# Patient Record
Sex: Male | Born: 1970 | Race: White | Hispanic: No | Marital: Married | Smoking: Never smoker
Health system: Southern US, Community
[De-identification: ages and names within clinical notes are randomized; demographics above are authoritative.]

## PROBLEM LIST (undated history)

## (undated) DIAGNOSIS — I10 Essential (primary) hypertension: Secondary | ICD-10-CM

## (undated) HISTORY — PX: EYE SURGERY: SHX253

## (undated) HISTORY — DX: Essential (primary) hypertension: I10

---

## 2003-06-17 ENCOUNTER — Emergency Department (HOSPITAL_COMMUNITY): Admission: EM | Admit: 2003-06-17 | Discharge: 2003-06-17 | Payer: Self-pay | Admitting: Emergency Medicine

## 2003-06-24 ENCOUNTER — Emergency Department (HOSPITAL_COMMUNITY): Admission: EM | Admit: 2003-06-24 | Discharge: 2003-06-24 | Payer: Self-pay | Admitting: Emergency Medicine

## 2003-07-04 ENCOUNTER — Encounter (HOSPITAL_COMMUNITY): Admission: RE | Admit: 2003-07-04 | Discharge: 2003-07-12 | Payer: Self-pay | Admitting: Preventative Medicine

## 2003-07-16 ENCOUNTER — Encounter (HOSPITAL_COMMUNITY): Admission: RE | Admit: 2003-07-16 | Discharge: 2003-08-15 | Payer: Self-pay | Admitting: Preventative Medicine

## 2003-09-11 ENCOUNTER — Encounter (HOSPITAL_COMMUNITY): Admission: RE | Admit: 2003-09-11 | Discharge: 2003-10-11 | Payer: Self-pay | Admitting: Orthopedic Surgery

## 2005-10-19 ENCOUNTER — Ambulatory Visit (HOSPITAL_COMMUNITY): Admission: RE | Admit: 2005-10-19 | Discharge: 2005-10-19 | Payer: Self-pay | Admitting: Internal Medicine

## 2006-07-28 IMAGING — CR DG LUMBAR SPINE COMPLETE 4+V
5 series · 5 of 5 positions shown · non-contrast
Comparison: none

CLINICAL DATA: Low back pain

Lumbar spine five-view:
No previous for comparison. Bilateral pelvic phleboliths.  There is no evidence
of lumbar spine fracture.  Alignment is normal.  Intervertebral disc spaces are
maintained, and no other significant bone abnormalities are identified.

[view not recorded (1 of 5)]
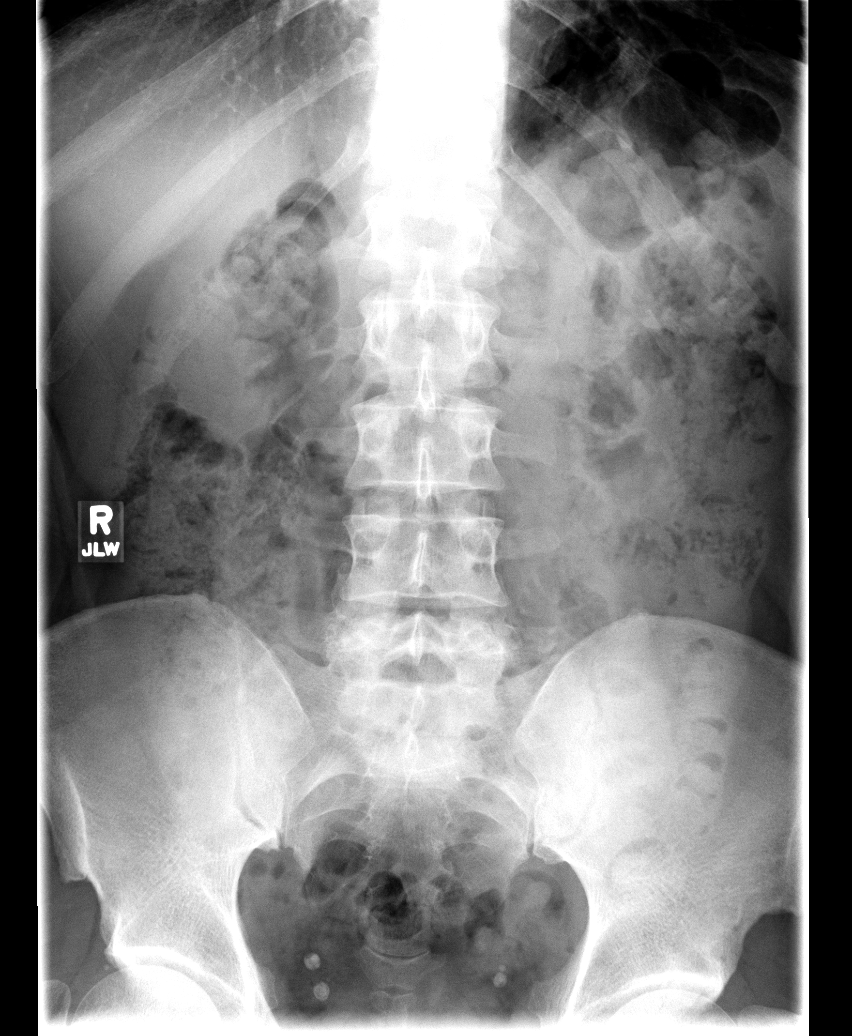

[view not recorded (2 of 5)]
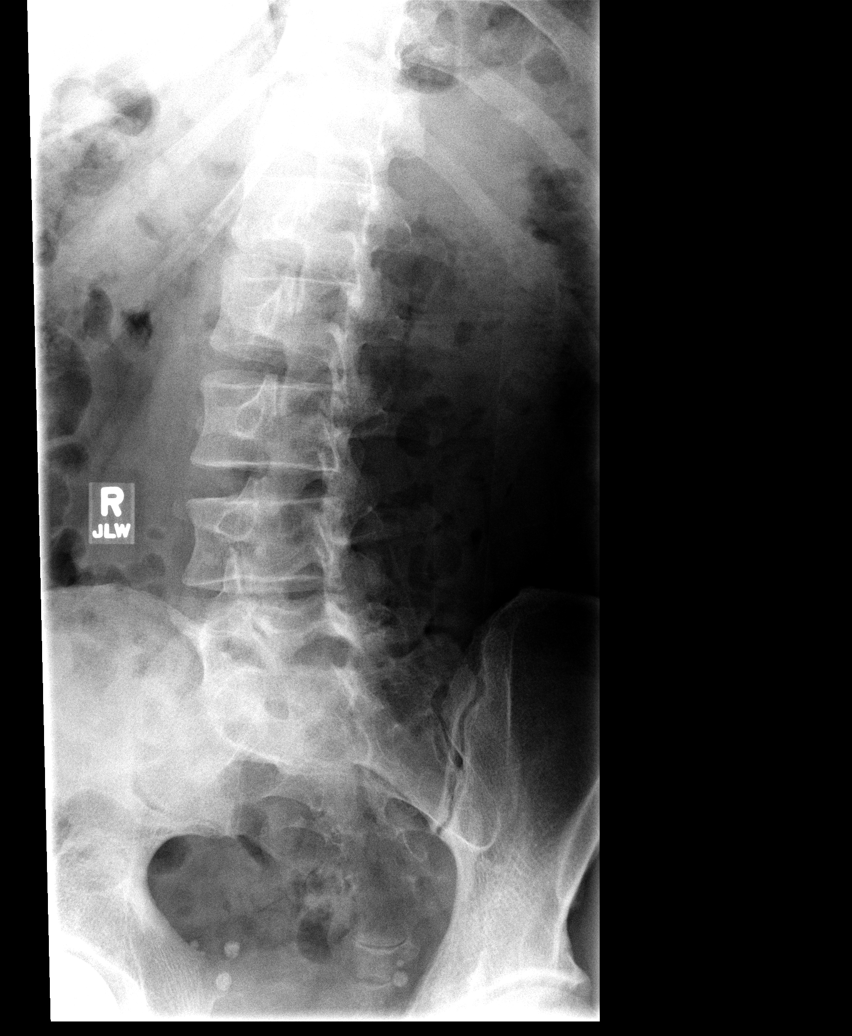

[view not recorded (3 of 5)]
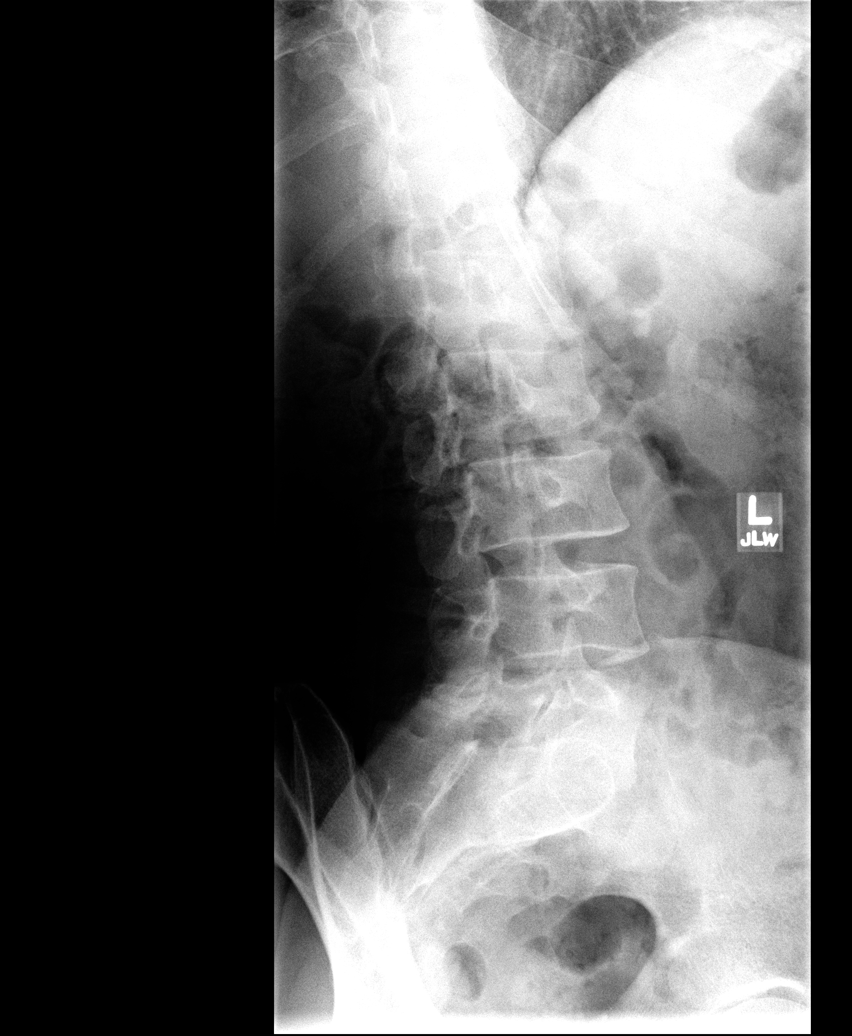

[view not recorded (4 of 5)]
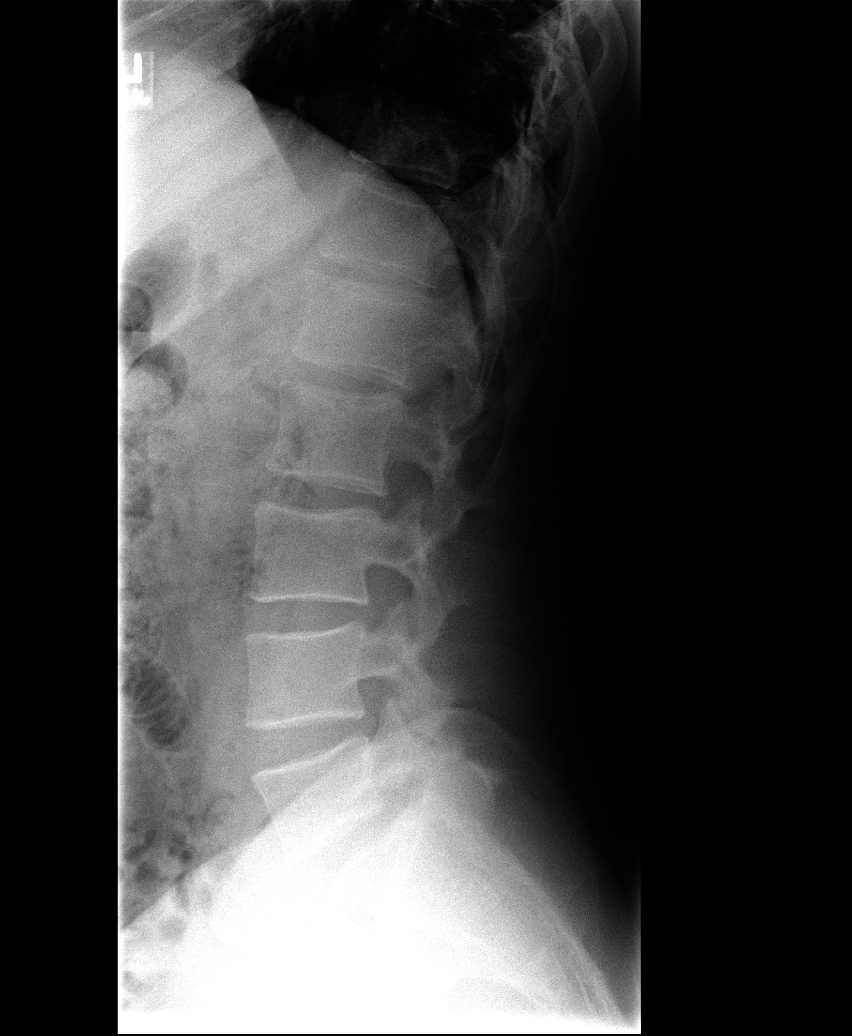

[view not recorded (5 of 5)]
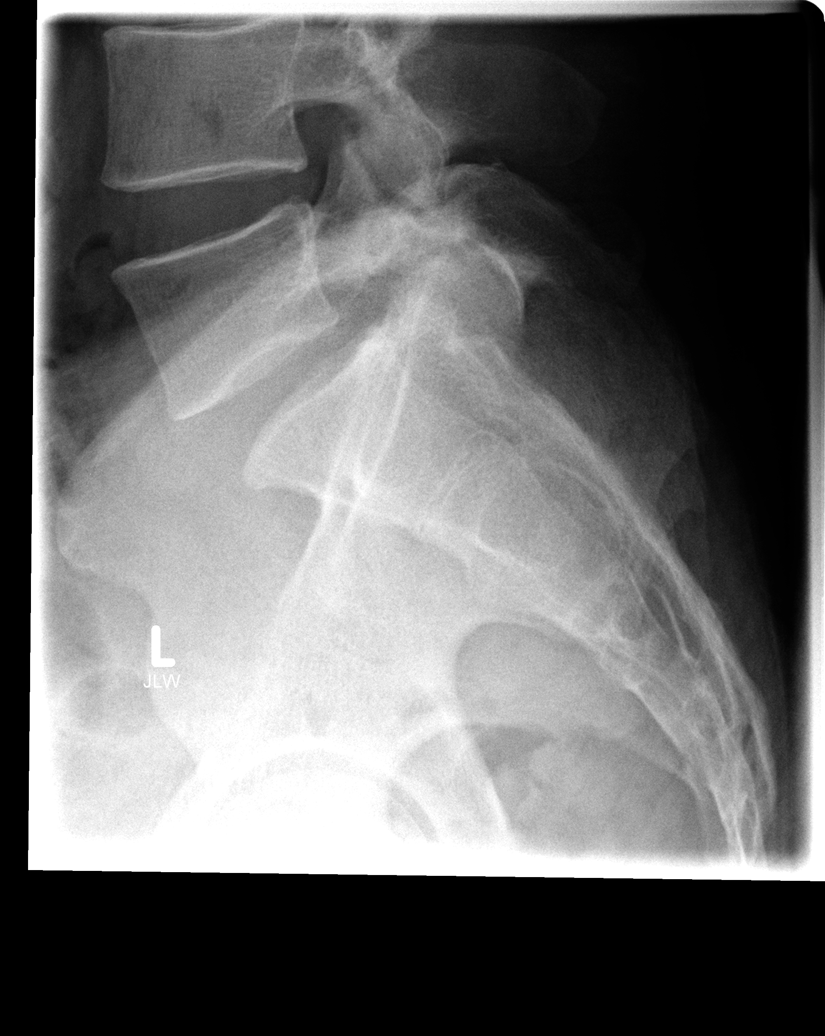

[5 of 5 positions shown; findings below may reference images not displayed]

IMPRESSION: Negative lumbar spine radiographs.

Thoracic spine 2 view:

12 rib-bearing thoracic segments. No previous for comparison.   There is no
evidence of thoracic spine fracture.  Alignment is normal.  No other significant
bone abnormalities are identified.
IMPRESSION: Negative thoracic spine radiographs.

## 2006-07-28 IMAGING — CR DG THORACIC SPINE 2V
3 series · 3 of 3 positions shown · non-contrast
Comparison: none

CLINICAL DATA: Low back pain

Lumbar spine five-view:
No previous for comparison. Bilateral pelvic phleboliths.  There is no evidence
of lumbar spine fracture.  Alignment is normal.  Intervertebral disc spaces are
maintained, and no other significant bone abnormalities are identified.

[view not recorded (1 of 3)]
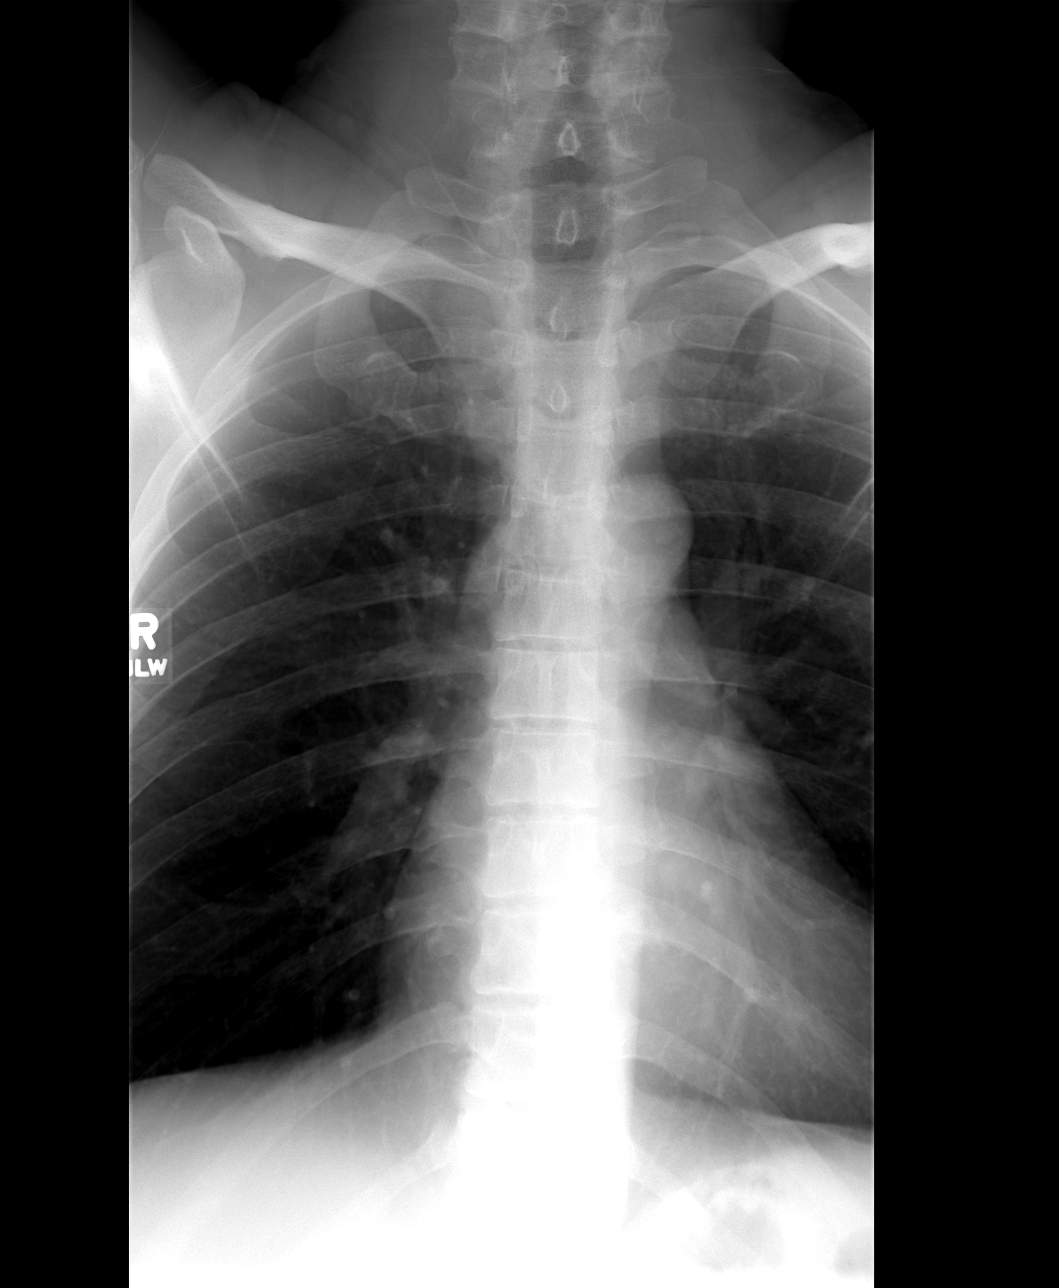

[view not recorded (2 of 3)]
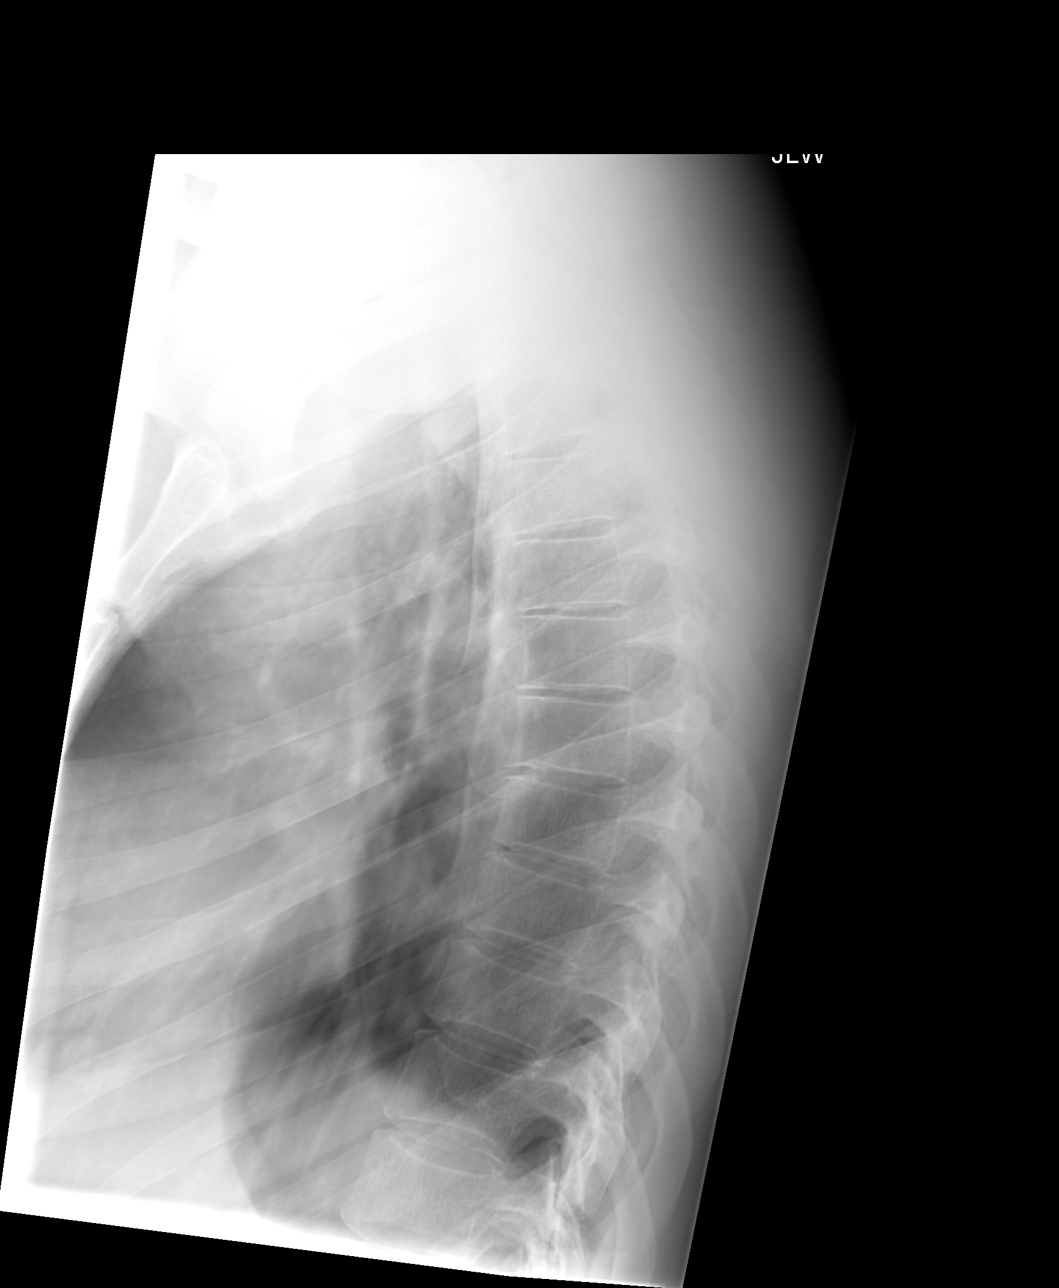

[view not recorded (3 of 3)]
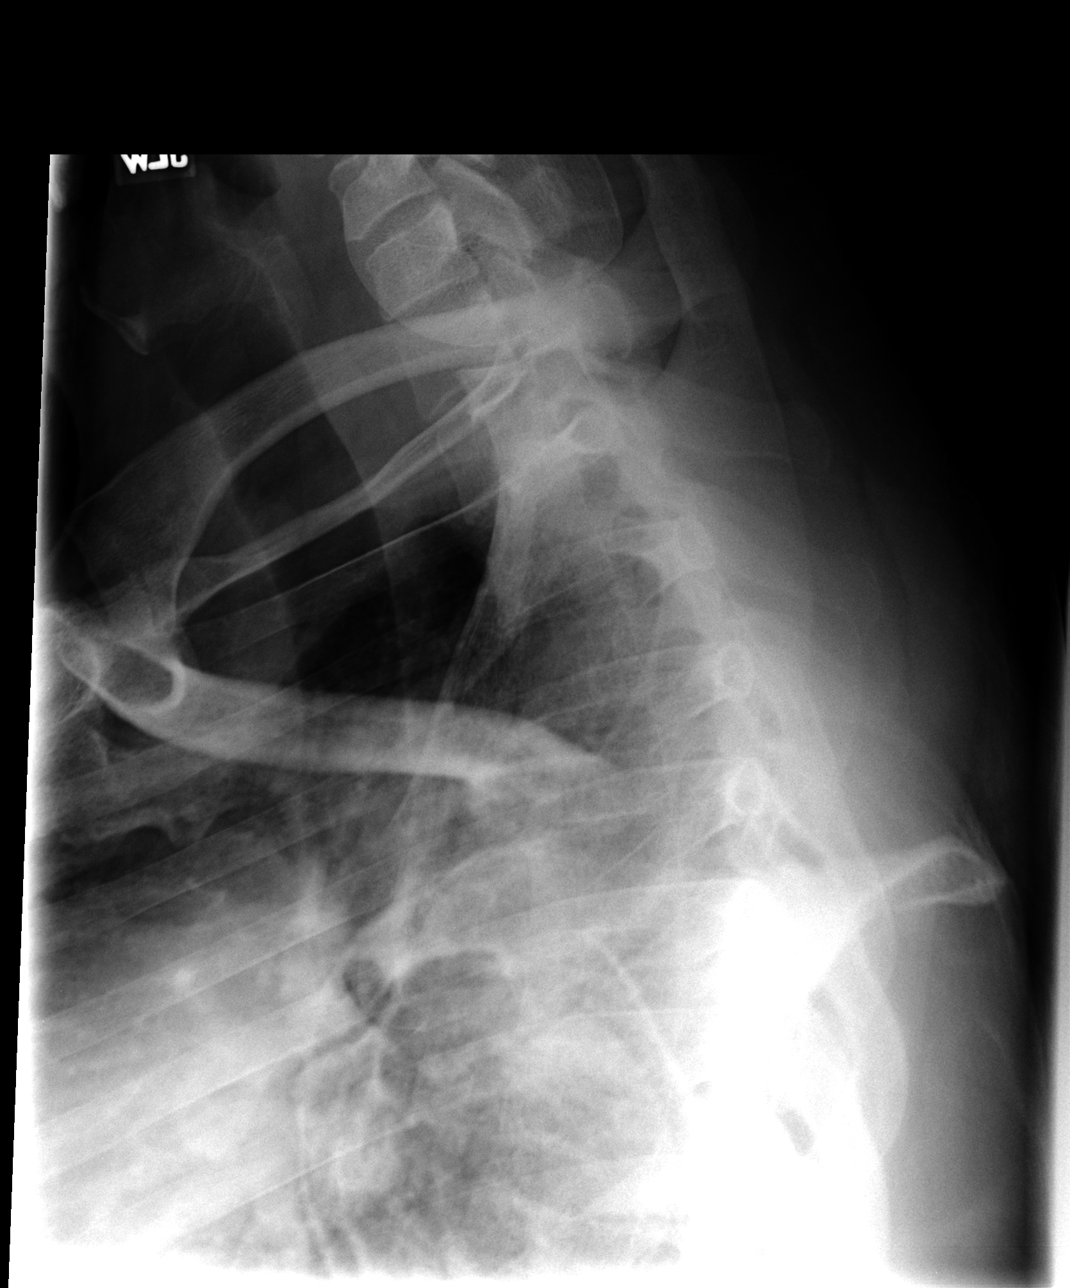

[3 of 3 positions shown; findings below may reference images not displayed]

IMPRESSION: Negative lumbar spine radiographs.

Thoracic spine 2 view:

12 rib-bearing thoracic segments. No previous for comparison.   There is no
evidence of thoracic spine fracture.  Alignment is normal.  No other significant
bone abnormalities are identified.
IMPRESSION: Negative thoracic spine radiographs.

## 2015-07-23 ENCOUNTER — Encounter (INDEPENDENT_AMBULATORY_CARE_PROVIDER_SITE_OTHER): Payer: Self-pay | Admitting: *Deleted

## 2015-08-05 ENCOUNTER — Ambulatory Visit (INDEPENDENT_AMBULATORY_CARE_PROVIDER_SITE_OTHER): Payer: BLUE CROSS/BLUE SHIELD | Admitting: Internal Medicine

## 2015-08-05 ENCOUNTER — Encounter (INDEPENDENT_AMBULATORY_CARE_PROVIDER_SITE_OTHER): Payer: Self-pay | Admitting: Internal Medicine

## 2015-08-05 ENCOUNTER — Other Ambulatory Visit (INDEPENDENT_AMBULATORY_CARE_PROVIDER_SITE_OTHER): Payer: Self-pay | Admitting: Internal Medicine

## 2015-08-05 ENCOUNTER — Telehealth (INDEPENDENT_AMBULATORY_CARE_PROVIDER_SITE_OTHER): Payer: Self-pay | Admitting: *Deleted

## 2015-08-05 VITALS — BP 108/80 | HR 64 | Temp 97.5°F | Ht 71.0 in | Wt 206.8 lb

## 2015-08-05 DIAGNOSIS — K625 Hemorrhage of anus and rectum: Secondary | ICD-10-CM | POA: Diagnosis not present

## 2015-08-05 DIAGNOSIS — I10 Essential (primary) hypertension: Secondary | ICD-10-CM | POA: Insufficient documentation

## 2015-08-05 DIAGNOSIS — Z1211 Encounter for screening for malignant neoplasm of colon: Secondary | ICD-10-CM

## 2015-08-05 NOTE — Progress Notes (Signed)
   Subjective:    Patient ID: Richard Nicholson, male    DOB: Feb 19, 1971, 45 y.o.   MRN: [id number]  HPI Referred to our office by Dr. Willey Blade for rectal bleeding x 1 year. States he will have rectal bleeding in his stool. He describes as bright red in the commode. Symptoms off and on for a year or better. He says he can goes for months without bleeding. He saw Dr. Willey Blade and ordered CBC. Stool was negative in his office. Did not feel any rectal masses. Appetite is good. No weight loss.  BMs are normal.  No recent melena or BRRB. No abdominal.  No family hx of colon cancer as far as he knows.  No NSAIDs.   07/16/2015 H andH 14.7 and 42.9, MCV 91, Platelet ct 199  Review of Systems Past Medical History  Diagnosis Date  . Hypertension     Past Surgical History  Procedure Laterality Date  . Eye surgery      lazy eyed    No Known Allergies  No current outpatient prescriptions on file prior to visit.   No current facility-administered medications on file prior to visit.   Current Outpatient Prescriptions  Medication Sig Dispense Refill  . lisinopril (PRINIVIL,ZESTRIL) 20 MG tablet Take 40 mg by mouth daily.      No current facility-administered medications for this visit.        Objective:   Physical Exam Blood pressure 108/80, pulse 64, temperature 97.5 F (36.4 C), height 5\' 11"  (1.803 m), weight 206 lb 12.8 oz (93.804 kg).  Alert and oriented. Skin warm and dry. Oral mucosa is moist.   . Sclera anicteric, conjunctivae is pink. Thyroid not enlarged. No cervical lymphadenopathy. Lungs clear. Heart regular rate and rhythm.  Abdomen is soft. Bowel sounds are positive. No hepatomegaly. No abdominal masses felt. No tenderness.  No edema to lower extremities.  Rectal deferred.       Assessment & Plan:  Rectal bleeding. ? Etiology.  Colonic neoplasm needs to be ruled. AVM, polyp, ulcer, hemorroid and diverticular bleed in the differential.  The risks and benefits such as perforation,  bleeding, and infection were reviewed with the patient and is agreeable.

## 2015-08-05 NOTE — Patient Instructions (Signed)
Colonoscopy.  The risks and benefits such as perforation, bleeding, and infection were reviewed with the patient and is agreeable. 

## 2015-08-05 NOTE — Telephone Encounter (Signed)
Patient needs trilyte 

## 2015-08-11 MED ORDER — PEG 3350-KCL-NA BICARB-NACL 420 G PO SOLR: 4000.0000 mL | Freq: Once | ORAL | Status: DC

## 2015-09-03 ENCOUNTER — Encounter (HOSPITAL_COMMUNITY): Payer: Self-pay

## 2015-09-03 ENCOUNTER — Encounter (HOSPITAL_COMMUNITY)
Admission: RE | Disposition: A | Discharge status: Home / Self Care | Payer: Self-pay | Source: Ambulatory Visit | Attending: Internal Medicine

## 2015-09-03 ENCOUNTER — Ambulatory Visit (HOSPITAL_COMMUNITY)
Admission: RE | Admit: 2015-09-03 | Discharge: 2015-09-03 | Disposition: A | Discharge status: Home / Self Care | Payer: BLUE CROSS/BLUE SHIELD | Source: Ambulatory Visit | Attending: Internal Medicine | Admitting: Internal Medicine

## 2015-09-03 DIAGNOSIS — K625 Hemorrhage of anus and rectum: Secondary | ICD-10-CM

## 2015-09-03 DIAGNOSIS — K921 Melena: Secondary | ICD-10-CM | POA: Diagnosis not present

## 2015-09-03 DIAGNOSIS — K648 Other hemorrhoids: Secondary | ICD-10-CM | POA: Insufficient documentation

## 2015-09-03 DIAGNOSIS — D123 Benign neoplasm of transverse colon: Secondary | ICD-10-CM | POA: Diagnosis not present

## 2015-09-03 DIAGNOSIS — Z79899 Other long term (current) drug therapy: Secondary | ICD-10-CM | POA: Insufficient documentation

## 2015-09-03 DIAGNOSIS — I10 Essential (primary) hypertension: Secondary | ICD-10-CM | POA: Insufficient documentation

## 2015-09-03 HISTORY — PX: COLONOSCOPY: SHX5424

## 2015-09-03 SURGERY — COLONOSCOPY
Anesthesia: Moderate Sedation

## 2015-09-03 MED ORDER — MEPERIDINE HCL 50 MG/ML IJ SOLN
INTRAMUSCULAR | Status: AC
  Filled 2015-09-03: qty 1

## 2015-09-03 MED ORDER — SODIUM CHLORIDE 0.9 % IV SOLN
INTRAVENOUS | Status: DC
  Administered 2015-09-03: 13:00:00 via INTRAVENOUS

## 2015-09-03 MED ORDER — MIDAZOLAM HCL 5 MG/5ML IJ SOLN
INTRAMUSCULAR | Status: AC
  Filled 2015-09-03: qty 5

## 2015-09-03 MED ORDER — MIDAZOLAM HCL 5 MG/5ML IJ SOLN
INTRAMUSCULAR | Status: DC | PRN
  Administered 2015-09-03 (×2): 2 mg via INTRAVENOUS
  Administered 2015-09-03 (×2): 3 mg via INTRAVENOUS

## 2015-09-03 MED ORDER — BENEFIBER DRINK MIX PO PACK: 4.0000 g | PACK | Freq: Every day | ORAL | Status: AC

## 2015-09-03 MED ORDER — STERILE WATER FOR IRRIGATION IR SOLN
Status: DC | PRN
  Administered 2015-09-03: 14:00:00

## 2015-09-03 MED ORDER — MEPERIDINE HCL 50 MG/ML IJ SOLN
INTRAMUSCULAR | Status: DC | PRN
  Administered 2015-09-03 (×4): 25 mg via INTRAVENOUS

## 2015-09-03 MED ORDER — MIDAZOLAM HCL 5 MG/5ML IJ SOLN
INTRAMUSCULAR | Status: AC
  Filled 2015-09-03: qty 10

## 2015-09-03 NOTE — H&P (Signed)
Richard Nicholson is an 45 y.o. male.   Chief Complaint: Patient is here for colonoscopy. HPI: Patient is 45 year old Caucasian male who presents with over a year history of intermittent rectal bleeding. Bleeding always occurs his bowels. However. One episode where he passed flatus and large amount of bright red blood. He denies diarrhea or constipation. He generally has 4-6 stools per day. This has been his pattern for years. He does not have abdominal pain or urgency. Almost all of his stools are formed. He does not have nocturnal bowel movements. Family history is negative for CRC.  Past Medical History  Diagnosis Date  . Hypertension     Past Surgical History  Procedure Laterality Date  . Eye surgery      lazy eyed    History reviewed. No pertinent family history. Social History:  reports that he has never smoked. He does not have any smokeless tobacco history on file. He reports that he drinks alcohol. He reports that he does not use illicit drugs.  Allergies: No Known Allergies  Medications Prior to Admission  Medication Sig Dispense Refill  . lisinopril (PRINIVIL,ZESTRIL) 40 MG tablet Take 1 tablet by mouth daily.    . polyethylene glycol-electrolytes (NULYTELY/GOLYTELY) 420 g solution Take 4,000 mLs by mouth once. 4000 mL 0    No results found for this or any previous visit (from the past 48 hour(s)). No results found.  ROS  Blood pressure 151/99, pulse 87, temperature 98.4 F (36.9 C), temperature source Oral, resp. rate 22, height 5\' 11"  (1.803 m), weight 209 lb (94.802 kg), SpO2 100 %. Physical Exam  Constitutional: He appears well-developed and well-nourished.  HENT:  Mouth/Throat: Oropharynx is clear and moist.  Prominent veins involving left half of palate  Eyes: Conjunctivae are normal. No scleral icterus.  Neck: No thyromegaly present.  Cardiovascular: Normal rate, regular rhythm and normal heart sounds.   No murmur heard. Respiratory: Effort normal and breath  sounds normal.  GI: Soft. He exhibits no distension and no mass. There is no tenderness.  Musculoskeletal: He exhibits no edema.  Lymphadenopathy:    He has no cervical adenopathy.  Neurological: He is alert.  Skin: Skin is warm and dry.     Assessment/Plan  Rectal bleeding.  Diagnostic colonoscopy.  Rogene Houston, MD 09/03/2015, 1:29 PM

## 2015-09-03 NOTE — Discharge Instructions (Signed)
Resume usual medications and diet. Benefiber 4 g by mouth daily at bedtime. No driving for 24 hours. Physician will call with biopsy results.    Colonoscopy, Care After These instructions give you information on caring for yourself after your procedure. Your doctor may also give you more specific instructions. Call your doctor if you have any problems or questions after your procedure. HOME CARE  Do not drive for 24 hours.  Do not sign important papers or use machinery for 24 hours.  You may shower.  You may go back to your usual activities, but go slower for the first 24 hours.  Take rest breaks often during the first 24 hours.  Walk around or use warm packs on your belly (abdomen) if you have belly cramping or gas.  Drink enough fluids to keep your pee (urine) clear or pale yellow.  Resume your normal diet. Avoid heavy or fried foods.  Avoid drinking alcohol for 24 hours or as told by your doctor.  Only take medicines as told by your doctor. If a tissue sample (biopsy) was taken during the procedure:   Do not take aspirin or blood thinners for 7 days, or as told by your doctor.  Do not drink alcohol for 7 days, or as told by your doctor.  Eat soft foods for the first 24 hours. GET HELP IF: You still have a small amount of blood in your poop (stool) 2-3 days after the procedure. GET HELP RIGHT AWAY IF:  You have more than a small amount of blood in your poop.  You see clumps of tissue (blood clots) in your poop.  Your belly is puffy (swollen).  You feel sick to your stomach (nauseous) or throw up (vomit).  You have a fever.  You have belly pain that gets worse and medicine does not help. MAKE SURE YOU:  Understand these instructions.  Will watch your condition.  Will get help right away if you are not doing well or get worse.   This information is not intended to replace advice given to you by your health care provider. Make sure you discuss any questions  you have with your health care provider.   Document Released: 07/31/2010 Document Revised: 07/03/2013 Document Reviewed: 03/05/2013 Elsevier Interactive Patient Education 2016 [employer]   Colon Polyps Polyps are lumps of extra tissue growing inside the body. Polyps can grow in the large intestine (colon). Most colon polyps are noncancerous (benign). However, some colon polyps can become cancerous over time. Polyps that are larger than a pea may be harmful. To be safe, caregivers remove and test all polyps. CAUSES  Polyps form when mutations in the genes cause your cells to grow and divide even though no more tissue is needed. RISK FACTORS There are a number of risk factors that can increase your chances of getting colon polyps. They include:  Being older than 50 years.  Family history of colon polyps or colon cancer.  Long-term colon diseases, such as colitis or Crohn disease.  Being overweight.  Smoking.  Being inactive.  Drinking too much alcohol. SYMPTOMS  Most small polyps do not cause symptoms. If symptoms are present, they may include:  Blood in the stool. The stool may look dark red or black.  Constipation or diarrhea that lasts longer than 1 week. DIAGNOSIS People often do not know they have polyps until their caregiver finds them during a regular checkup. Your caregiver can use 4 tests to check for polyps:  Digital rectal exam.  The caregiver wears gloves and feels inside the rectum. This test would find polyps only in the rectum.  Barium enema. The caregiver puts a liquid called barium into your rectum before taking X-rays of your colon. Barium makes your colon look white. Polyps are dark, so they are easy to see in the X-ray pictures.  Sigmoidoscopy. A thin, flexible tube (sigmoidoscope) is placed into your rectum. The sigmoidoscope has a light and tiny camera in it. The caregiver uses the sigmoidoscope to look at the last third of your colon.  Colonoscopy.  This test is like sigmoidoscopy, but the caregiver looks at the entire colon. This is the most common method for finding and removing polyps. TREATMENT  Any polyps will be removed during a sigmoidoscopy or colonoscopy. The polyps are then tested for cancer. PREVENTION  To help lower your risk of getting more colon polyps:  Eat plenty of fruits and vegetables. Avoid eating fatty foods.  Do not smoke.  Avoid drinking alcohol.  Exercise every day.  Lose weight if recommended by your caregiver.  Eat plenty of calcium and folate. Foods that are rich in calcium include milk, cheese, and broccoli. Foods that are rich in folate include chickpeas, kidney beans, and spinach. HOME CARE INSTRUCTIONS Keep all follow-up appointments as directed by your caregiver. You may need periodic exams to check for polyps. SEEK MEDICAL CARE IF: You notice bleeding during a bowel movement.   This information is not intended to replace advice given to you by your health care provider. Make sure you discuss any questions you have with your health care provider.   Document Released: 03/24/2004 Document Revised: 07/19/2014 Document Reviewed: 09/07/2011 Elsevier Interactive Patient Education [employer].

## 2015-09-03 NOTE — Op Note (Addendum)
COLONOSCOPY PROCEDURE REPORT  PATIENT:  Richard Nicholson  MR#:  [id number] Birthdate:  03/15/1971, 45 y.o., male Endoscopist:  Dr. Rogene Houston, MD Referred By:  Dr. Asencion Noble, MD Procedure Date: 09/03/2015  Procedure:   Colonoscopy  Indications: Patient is 45 year old Caucasian male who presents with intermittent hematochezia of one-year duration. He has 4-6 stools per day. All of his stools are formed. He denies abdominal pain or urgency. Family history is negative for CRC.  Informed Consent:  The procedure and risks were reviewed with the patient and informed consent was obtained.  Medications:  Demerol 100 mg IV Versed 10 mg IV  First dose administered at 1336 Last dose administered at  1347  Description of procedure:  After a digital rectal exam was performed, that colonoscope was advanced from the anus through the rectum and colon to the area of the cecum, ileocecal valve and appendiceal orifice. The cecum was deeply intubated. These structures were well-seen and photographed for the record. From the level of the cecum and ileocecal valve, the scope was slowly and cautiously withdrawn. The mucosal surfaces were carefully surveyed utilizing scope tip to flexion to facilitate fold flattening as needed. The scope was pulled down into the rectum where a thorough exam including retroflexion was performed.  Findings:   Prep satisfactory. Normal mucosa of cecum and ascending colon. 3 mm polyp ablated via cold biopsy from hepatic flexure. Mucosa of rest of the colon and rectum was normal. Hemorrhoids noted proximal to dentate line.    Therapeutic/Diagnostic Maneuvers Performed:  See above  Complications:  None  EBL:  Minimal  Cecal Withdrawal Time:  11 minutes  Impression:  Examination performed to cecum. Small polyp ablated via cold biopsy from hepatic flexure. Internal hemorrhoids.  Recommendations:  Standard instructions given. Benefiber 4 g by mouth daily at bedtime. I  will contact patient with biopsy results and further recommendations.  Shanitha Twining U  09/03/2015 2:22 PM  CC: Dr. Asencion Noble, MD & Dr. Rayne Du ref. provider found

## 2015-09-08 ENCOUNTER — Encounter (HOSPITAL_COMMUNITY): Payer: Self-pay | Admitting: Internal Medicine

## 2015-09-17 ENCOUNTER — Telehealth (INDEPENDENT_AMBULATORY_CARE_PROVIDER_SITE_OTHER): Payer: Self-pay | Admitting: Internal Medicine

## 2015-09-17 NOTE — Telephone Encounter (Signed)
Mr. Richard Nicholson left a message saying he received Dr. Olevia Perches message in asking how the [place] is working. Per Mr. Locken, he's not taking Benefiber. He changed his lifestyle and his diet and is "regular" now. He appreciates everything Dr. Laural Golden did for him and wanted to make Dr. Laural Golden aware of this.  Thank you.

## 2015-09-17 NOTE — Telephone Encounter (Signed)
Forwarded to Dr.Rehman to make him aware.

## 2015-09-18 NOTE — Telephone Encounter (Signed)
Richard Nicholson's note appreciated. No action needed.

## 2015-10-17 DIAGNOSIS — I10 Essential (primary) hypertension: Secondary | ICD-10-CM | POA: Diagnosis not present

## 2015-10-17 DIAGNOSIS — Z6830 Body mass index (BMI) 30.0-30.9, adult: Secondary | ICD-10-CM | POA: Diagnosis not present

## 2016-02-23 DIAGNOSIS — I10 Essential (primary) hypertension: Secondary | ICD-10-CM | POA: Diagnosis not present

## 2016-03-23 ENCOUNTER — Encounter (INDEPENDENT_AMBULATORY_CARE_PROVIDER_SITE_OTHER): Payer: Self-pay

## 2016-07-27 DIAGNOSIS — Z125 Encounter for screening for malignant neoplasm of prostate: Secondary | ICD-10-CM | POA: Diagnosis not present

## 2016-07-27 DIAGNOSIS — Z79899 Other long term (current) drug therapy: Secondary | ICD-10-CM | POA: Diagnosis not present

## 2016-07-27 DIAGNOSIS — I10 Essential (primary) hypertension: Secondary | ICD-10-CM | POA: Diagnosis not present

## 2016-08-06 DIAGNOSIS — R05 Cough: Secondary | ICD-10-CM | POA: Diagnosis not present

## 2016-08-06 DIAGNOSIS — E785 Hyperlipidemia, unspecified: Secondary | ICD-10-CM | POA: Diagnosis not present

## 2016-08-06 DIAGNOSIS — I10 Essential (primary) hypertension: Secondary | ICD-10-CM | POA: Diagnosis not present

## 2016-08-06 DIAGNOSIS — Z6829 Body mass index (BMI) 29.0-29.9, adult: Secondary | ICD-10-CM | POA: Diagnosis not present

## 2016-12-09 DIAGNOSIS — I10 Essential (primary) hypertension: Secondary | ICD-10-CM | POA: Diagnosis not present

## 2016-12-09 DIAGNOSIS — E785 Hyperlipidemia, unspecified: Secondary | ICD-10-CM | POA: Diagnosis not present

## 2017-01-10 DIAGNOSIS — I10 Essential (primary) hypertension: Secondary | ICD-10-CM | POA: Diagnosis not present

## 2017-04-28 DIAGNOSIS — S61211A Laceration without foreign body of left index finger without damage to nail, initial encounter: Secondary | ICD-10-CM | POA: Diagnosis not present

## 2017-07-29 DIAGNOSIS — I10 Essential (primary) hypertension: Secondary | ICD-10-CM | POA: Diagnosis not present

## 2017-07-29 DIAGNOSIS — Z6830 Body mass index (BMI) 30.0-30.9, adult: Secondary | ICD-10-CM | POA: Diagnosis not present

## 2017-07-29 DIAGNOSIS — Z23 Encounter for immunization: Secondary | ICD-10-CM | POA: Diagnosis not present

## 2017-11-23 DIAGNOSIS — H11002 Unspecified pterygium of left eye: Secondary | ICD-10-CM | POA: Diagnosis not present

## 2017-12-12 DIAGNOSIS — I10 Essential (primary) hypertension: Secondary | ICD-10-CM | POA: Diagnosis not present

## 2017-12-14 DIAGNOSIS — H11002 Unspecified pterygium of left eye: Secondary | ICD-10-CM | POA: Diagnosis not present

## 2017-12-14 DIAGNOSIS — H52213 Irregular astigmatism, bilateral: Secondary | ICD-10-CM | POA: Diagnosis not present

## 2017-12-14 DIAGNOSIS — H01021 Squamous blepharitis right upper eyelid: Secondary | ICD-10-CM | POA: Diagnosis not present

## 2017-12-14 DIAGNOSIS — H01022 Squamous blepharitis right lower eyelid: Secondary | ICD-10-CM | POA: Diagnosis not present

## 2018-03-30 DIAGNOSIS — Z79899 Other long term (current) drug therapy: Secondary | ICD-10-CM | POA: Diagnosis not present

## 2018-03-30 DIAGNOSIS — Z125 Encounter for screening for malignant neoplasm of prostate: Secondary | ICD-10-CM | POA: Diagnosis not present

## 2018-03-30 DIAGNOSIS — I10 Essential (primary) hypertension: Secondary | ICD-10-CM | POA: Diagnosis not present

## 2018-04-06 DIAGNOSIS — E785 Hyperlipidemia, unspecified: Secondary | ICD-10-CM | POA: Diagnosis not present

## 2018-04-06 DIAGNOSIS — I10 Essential (primary) hypertension: Secondary | ICD-10-CM | POA: Diagnosis not present

## 2021-04-06 DIAGNOSIS — Z79899 Other long term (current) drug therapy: Secondary | ICD-10-CM | POA: Diagnosis not present

## 2021-04-06 DIAGNOSIS — E785 Hyperlipidemia, unspecified: Secondary | ICD-10-CM | POA: Diagnosis not present

## 2021-04-06 DIAGNOSIS — I10 Essential (primary) hypertension: Secondary | ICD-10-CM | POA: Diagnosis not present

## 2021-04-13 DIAGNOSIS — E785 Hyperlipidemia, unspecified: Secondary | ICD-10-CM | POA: Diagnosis not present

## 2021-04-13 DIAGNOSIS — I10 Essential (primary) hypertension: Secondary | ICD-10-CM | POA: Diagnosis not present

## 2021-04-13 DIAGNOSIS — Z23 Encounter for immunization: Secondary | ICD-10-CM | POA: Diagnosis not present

## 2021-06-18 DIAGNOSIS — H3589 Other specified retinal disorders: Secondary | ICD-10-CM | POA: Diagnosis not present

## 2021-09-16 DIAGNOSIS — I10 Essential (primary) hypertension: Secondary | ICD-10-CM | POA: Diagnosis not present

## 2021-12-30 DIAGNOSIS — I10 Essential (primary) hypertension: Secondary | ICD-10-CM | POA: Diagnosis not present

## 2022-09-01 ENCOUNTER — Encounter (INDEPENDENT_AMBULATORY_CARE_PROVIDER_SITE_OTHER): Payer: Self-pay | Admitting: *Deleted

## 2022-09-16 DIAGNOSIS — I10 Essential (primary) hypertension: Secondary | ICD-10-CM | POA: Diagnosis not present

## 2022-09-16 DIAGNOSIS — J019 Acute sinusitis, unspecified: Secondary | ICD-10-CM | POA: Diagnosis not present

## 2024-06-21 DIAGNOSIS — I10 Essential (primary) hypertension: Secondary | ICD-10-CM | POA: Diagnosis not present

## 2024-06-21 DIAGNOSIS — Z79899 Other long term (current) drug therapy: Secondary | ICD-10-CM | POA: Diagnosis not present

## 2024-06-21 DIAGNOSIS — E785 Hyperlipidemia, unspecified: Secondary | ICD-10-CM | POA: Diagnosis not present

## 2024-06-28 DIAGNOSIS — I10 Essential (primary) hypertension: Secondary | ICD-10-CM | POA: Diagnosis not present

## 2024-06-28 DIAGNOSIS — E785 Hyperlipidemia, unspecified: Secondary | ICD-10-CM | POA: Diagnosis not present

## 2024-07-04 ENCOUNTER — Encounter (INDEPENDENT_AMBULATORY_CARE_PROVIDER_SITE_OTHER): Payer: Self-pay | Admitting: *Deleted
